# Patient Record
Sex: Male | Born: 1995 | Race: Black or African American | Hispanic: No | Marital: Single | Smoking: Current some day smoker
Health system: Southern US, Community
[De-identification: ages and names within clinical notes are randomized; demographics above are authoritative.]

## PROBLEM LIST (undated history)

## (undated) DIAGNOSIS — J45909 Unspecified asthma, uncomplicated: Secondary | ICD-10-CM

---
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
## 2023-01-11 DIAGNOSIS — R768 Other specified abnormal immunological findings in serum: Secondary | ICD-10-CM | POA: Diagnosis not present

## 2023-01-12 DIAGNOSIS — R768 Other specified abnormal immunological findings in serum: Secondary | ICD-10-CM | POA: Diagnosis not present

## 2023-01-27 DIAGNOSIS — Z0110 Encounter for examination of ears and hearing without abnormal findings: Secondary | ICD-10-CM | POA: Diagnosis not present
